# Patient Record
Sex: Female | Born: 1995 | Hispanic: No | Marital: Single | Smoking: Never smoker
Health system: Southern US, Community
[De-identification: ages and names within clinical notes are randomized; demographics above are authoritative.]

---
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
## 2019-08-28 ENCOUNTER — Ambulatory Visit: Payer: Self-pay | Attending: Internal Medicine

## 2019-08-28 DIAGNOSIS — Z23 Encounter for immunization: Secondary | ICD-10-CM

## 2019-08-28 NOTE — Progress Notes (Signed)
   Covid-19 Vaccination Clinic  Name:  Traci Frey    MRN: [id number] DOB: [birth year]  08/28/2019  Traci Frey was observed post Covid-19 immunization for 15 minutes without incident. She was provided with Vaccine Information Sheet and instruction to access the V-Safe system.   Traci Frey was instructed to call 911 with any severe reactions post vaccine: Marland Kitchen Difficulty breathing  . Swelling of face and throat  . A fast heartbeat  . A bad rash all over body  . Dizziness and weakness   Immunizations Administered    Name Date Dose VIS Date Route   Pfizer COVID-19 Vaccine 08/28/2019 10:10 AM 0.3 mL 05/02/2019 Intramuscular   Manufacturer: [employer], [employer]   Lot: [id number]   NDC: [address]-4

## 2019-09-22 ENCOUNTER — Ambulatory Visit: Payer: Self-pay | Attending: Internal Medicine

## 2019-09-22 DIAGNOSIS — Z23 Encounter for immunization: Secondary | ICD-10-CM

## 2019-09-22 NOTE — Progress Notes (Signed)
   Covid-19 Vaccination Clinic  Name:  Traci Frey    MRN: [id number] DOB: 01/15/1996  09/22/2019  [name] was observed post Covid-19 immunization for 15 minutes without incident. She was provided with Vaccine Information Sheet and instruction to access the V-Safe system.   [name] was instructed to call 911 with any severe reactions post vaccine: Marland Kitchen Difficulty breathing  . Swelling of face and throat  . A fast heartbeat  . A bad rash all over body  . Dizziness and weakness   Immunizations Administered    Name Date Dose VIS Date Route   Pfizer COVID-19 Vaccine 09/22/2019  9:45 AM 0.3 mL 07/16/2018 Intramuscular   Manufacturer: [employer], [employer]   Lot: [id number]   NDC: [address]-3
